# Patient Record
Sex: Female | Born: 1980 | Race: Black or African American | Hispanic: No | Marital: Single | Smoking: Never smoker
Health system: Southern US, Community
[De-identification: ages and names within clinical notes are randomized; demographics above are authoritative.]

---

## 2014-12-12 ENCOUNTER — Emergency Department (HOSPITAL_COMMUNITY)
Admission: EM | Admit: 2014-12-12 | Discharge: 2014-12-12 | Disposition: A | Discharge status: Home / Self Care | Payer: Self-pay | Attending: Emergency Medicine | Admitting: Emergency Medicine

## 2014-12-12 ENCOUNTER — Encounter (HOSPITAL_COMMUNITY): Payer: Self-pay | Admitting: Emergency Medicine

## 2014-12-12 DIAGNOSIS — R21 Rash and other nonspecific skin eruption: Secondary | ICD-10-CM | POA: Insufficient documentation

## 2014-12-12 MED ORDER — BACITRACIN 500 UNIT/GM EX OINT
1.0000 "application " | TOPICAL_OINTMENT | Freq: Two times a day (BID) | CUTANEOUS | Status: DC
  Administered 2014-12-12: 1 via TOPICAL
  Filled 2014-12-12: qty 0.9

## 2014-12-12 MED ORDER — SULFAMETHOXAZOLE-TRIMETHOPRIM 800-160 MG PO TABS: 1.0000 | ORAL_TABLET | Freq: Two times a day (BID) | ORAL | Status: DC

## 2014-12-12 MED ORDER — HYDROXYZINE HCL 25 MG PO TABS: 25.0000 mg | ORAL_TABLET | Freq: Four times a day (QID) | ORAL | Status: DC

## 2014-12-12 MED ORDER — LIDOCAINE HCL (PF) 1 % IJ SOLN
2.1000 mL | Freq: Once | INTRAMUSCULAR | Status: AC
  Administered 2014-12-12: 2.1 mL
  Filled 2014-12-12: qty 5

## 2014-12-12 MED ORDER — HYDROXYZINE HCL 25 MG PO TABS
50.0000 mg | ORAL_TABLET | Freq: Once | ORAL | Status: AC
  Administered 2014-12-12: 50 mg via ORAL
  Filled 2014-12-12: qty 2

## 2014-12-12 MED ORDER — CEPHALEXIN 500 MG PO CAPS: 500.0000 mg | ORAL_CAPSULE | Freq: Four times a day (QID) | ORAL | Status: DC

## 2014-12-12 MED ORDER — CEFTRIAXONE SODIUM 1 G IJ SOLR
1.0000 g | Freq: Once | INTRAMUSCULAR | Status: AC
  Administered 2014-12-12: 1 g via INTRAMUSCULAR
  Filled 2014-12-12: qty 10
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
## 2014-12-12 NOTE — Discharge Instructions (Signed)
Wash the affected area with soap and water and apply a thin layer of topical antibiotic ointment. Do this every 12 hours.   Do not use rubbing alcohol or hydrogen peroxide.                        Look for signs of  Worsening infection: if you see redness, if the area becomes warm, if pain increases sharply, there is discharge (pus), if red streaks appear or you develop fever or vomiting, RETURN immediately to the Emergency Department  for a recheck.   Take your antibiotics as directed and to completion. You should never have any leftover antibiotics! Push fluids and stay well hydrated.   Any antibiotic use can reduce the efficacy of hormonal birth control. Please use back up method of contraception.

## 2014-12-12 NOTE — ED Notes (Signed)
PA at bedside.

## 2014-12-12 NOTE — ED Notes (Signed)
Pt. Reports progressing itchy rashes at right antecubital area onset last week with drainage .

## 2016-06-04 ENCOUNTER — Emergency Department (HOSPITAL_COMMUNITY): Payer: Medicaid Other

## 2016-06-04 ENCOUNTER — Encounter (HOSPITAL_COMMUNITY): Payer: Self-pay | Admitting: Emergency Medicine

## 2016-06-04 ENCOUNTER — Emergency Department (HOSPITAL_COMMUNITY)
Admission: EM | Admit: 2016-06-04 | Discharge: 2016-06-04 | Disposition: A | Discharge status: Home / Self Care | Payer: Medicaid Other | Attending: Emergency Medicine | Admitting: Emergency Medicine

## 2016-06-04 DIAGNOSIS — S0990XA Unspecified injury of head, initial encounter: Secondary | ICD-10-CM | POA: Diagnosis present

## 2016-06-04 DIAGNOSIS — Y999 Unspecified external cause status: Secondary | ICD-10-CM | POA: Diagnosis not present

## 2016-06-04 DIAGNOSIS — Y92410 Unspecified street and highway as the place of occurrence of the external cause: Secondary | ICD-10-CM | POA: Insufficient documentation

## 2016-06-04 DIAGNOSIS — S0181XA Laceration without foreign body of other part of head, initial encounter: Secondary | ICD-10-CM | POA: Insufficient documentation

## 2016-06-04 DIAGNOSIS — S161XXA Strain of muscle, fascia and tendon at neck level, initial encounter: Secondary | ICD-10-CM | POA: Diagnosis not present

## 2016-06-04 DIAGNOSIS — Y939 Activity, unspecified: Secondary | ICD-10-CM | POA: Diagnosis not present

## 2016-06-04 DIAGNOSIS — T148XXA Other injury of unspecified body region, initial encounter: Secondary | ICD-10-CM

## 2016-06-04 MED ORDER — ACETAMINOPHEN 500 MG PO TABS: 1000.0000 mg | ORAL_TABLET | Freq: Three times a day (TID) | ORAL | 0 refills | Status: AC

## 2016-06-04 MED ORDER — LIDOCAINE-EPINEPHRINE-TETRACAINE (LET) SOLUTION
3.0000 mL | Freq: Once | NASAL | Status: AC
  Administered 2016-06-04: 3 mL via TOPICAL
  Filled 2016-06-04: qty 3

## 2016-06-04 MED ORDER — LIDOCAINE-EPINEPHRINE (PF) 2 %-1:200000 IJ SOLN
20.0000 mL | Freq: Once | INTRAMUSCULAR | Status: AC
  Administered 2016-06-04: 20 mL via INTRADERMAL
  Filled 2016-06-04: qty 20

## 2016-06-04 MED ORDER — LIDOCAINE-EPINEPHRINE 2 %-1:100000 IJ SOLN
10.0000 mL | Freq: Once | INTRAMUSCULAR | Status: DC
  Filled 2016-06-04: qty 10

## 2016-06-04 MED ORDER — IBUPROFEN 600 MG PO TABS: 600.0000 mg | ORAL_TABLET | Freq: Four times a day (QID) | ORAL | 0 refills | Status: DC | PRN

## 2016-06-04 MED ORDER — FENTANYL CITRATE (PF) 100 MCG/2ML IJ SOLN
50.0000 ug | Freq: Once | INTRAMUSCULAR | Status: AC
  Administered 2016-06-04: 50 ug via INTRAMUSCULAR
  Filled 2016-06-04: qty 2
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
## 2016-06-04 NOTE — ED Notes (Signed)
Pt stable, understands discharge instructions, and reasons for return.   

## 2016-06-04 NOTE — ED Notes (Signed)
[employer] at bedside 
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
## 2016-06-04 NOTE — ED Notes (Signed)
Dr. Cardama at bedside.  

## 2016-06-27 ENCOUNTER — Emergency Department (HOSPITAL_COMMUNITY)
Admission: EM | Admit: 2016-06-27 | Discharge: 2016-06-27 | Disposition: A | Discharge status: Home / Self Care | Payer: Medicaid Other | Attending: Emergency Medicine | Admitting: Emergency Medicine

## 2016-06-27 ENCOUNTER — Encounter (HOSPITAL_COMMUNITY): Payer: Self-pay

## 2016-06-27 DIAGNOSIS — Z4802 Encounter for removal of sutures: Secondary | ICD-10-CM | POA: Insufficient documentation

## 2016-06-27 NOTE — ED Triage Notes (Signed)
Pt presents for suture removal, placed 11/14 above R eyebrow.
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
## 2016-09-17 ENCOUNTER — Encounter (HOSPITAL_COMMUNITY): Payer: Self-pay | Admitting: Emergency Medicine

## 2016-09-17 ENCOUNTER — Emergency Department (HOSPITAL_COMMUNITY)
Admission: EM | Admit: 2016-09-17 | Discharge: 2016-09-17 | Disposition: A | Discharge status: Home / Self Care | Payer: No Typology Code available for payment source | Attending: Physician Assistant | Admitting: Physician Assistant

## 2016-09-17 DIAGNOSIS — Y999 Unspecified external cause status: Secondary | ICD-10-CM | POA: Insufficient documentation

## 2016-09-17 DIAGNOSIS — M545 Low back pain: Secondary | ICD-10-CM | POA: Insufficient documentation

## 2016-09-17 DIAGNOSIS — Y939 Activity, unspecified: Secondary | ICD-10-CM | POA: Diagnosis not present

## 2016-09-17 DIAGNOSIS — Y92410 Unspecified street and highway as the place of occurrence of the external cause: Secondary | ICD-10-CM | POA: Insufficient documentation

## 2016-09-17 MED ORDER — METHOCARBAMOL 500 MG PO TABS: 500.0000 mg | ORAL_TABLET | Freq: Two times a day (BID) | ORAL | 0 refills | Status: DC

## 2016-09-17 MED ORDER — IBUPROFEN 800 MG PO TABS: 800.0000 mg | ORAL_TABLET | Freq: Three times a day (TID) | ORAL | 0 refills | Status: DC

## 2016-09-17 MED ORDER — LIDOCAINE 5 % EX PTCH: 1.0000 | MEDICATED_PATCH | CUTANEOUS | 0 refills | Status: DC
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
## 2016-09-17 NOTE — Discharge Instructions (Signed)
Expect your soreness to increase over the next 2-3 days. Take it easy, but do not lay around too much as this may make any stiffness worse.  °Antiinflammatory medications: Take 500 mg of naproxen every 12 hours or 800 mg of ibuprofen every 8 hours for the next 3 days. Take these medications with food to avoid upset stomach. Choose only one of these medications, do not take them together. ° °Muscle relaxer: Robaxin is a muscle relaxer and may help loosen stiff muscles. Do not take the Robaxin while driving or performing other dangerous activities.  ° °Lidocaine patches: These are available via either prescription or over-the-counter. The over-the-counter option may be more economical one and are likely just as effective. There are multiple over-the-counter brands, such as Salonpas. ° °Exercises: Be sure to perform the attached exercises starting with three times a week and working up to performing them daily. This is an essential part of preventing long term problems.  ° °Follow up with a primary care provider for any future management of these complaints. °

## 2017-06-26 ENCOUNTER — Encounter (HOSPITAL_COMMUNITY): Payer: Self-pay | Admitting: Emergency Medicine

## 2017-06-26 ENCOUNTER — Emergency Department (HOSPITAL_COMMUNITY)
Admission: EM | Admit: 2017-06-26 | Discharge: 2017-06-27 | Disposition: A | Discharge status: Home / Self Care | Payer: Self-pay | Attending: Emergency Medicine | Admitting: Emergency Medicine

## 2017-06-26 DIAGNOSIS — M2550 Pain in unspecified joint: Secondary | ICD-10-CM | POA: Insufficient documentation

## 2017-06-26 DIAGNOSIS — J45909 Unspecified asthma, uncomplicated: Secondary | ICD-10-CM | POA: Insufficient documentation

## 2017-06-26 DIAGNOSIS — L301 Dyshidrosis [pompholyx]: Secondary | ICD-10-CM | POA: Insufficient documentation

## 2017-06-26 MED ORDER — DEXAMETHASONE SODIUM PHOSPHATE 10 MG/ML IJ SOLN
10.0000 mg | Freq: Once | INTRAMUSCULAR | Status: AC
  Administered 2017-06-27: 10 mg via INTRAVENOUS
  Filled 2017-06-26: qty 1

## 2017-06-26 MED ORDER — IPRATROPIUM-ALBUTEROL 0.5-2.5 (3) MG/3ML IN SOLN
3.0000 mL | Freq: Once | RESPIRATORY_TRACT | Status: AC
  Administered 2017-06-27: 3 mL via RESPIRATORY_TRACT
  Filled 2017-06-26: qty 3

## 2017-06-26 MED ORDER — SODIUM CHLORIDE 0.9 % IV BOLUS (SEPSIS)
1000.0000 mL | Freq: Once | INTRAVENOUS | Status: AC
  Administered 2017-06-27: 1000 mL via INTRAVENOUS

## 2017-06-26 NOTE — ED Triage Notes (Signed)
Patient here with complaints of generalized body aches and joint pain. Reports bilateral hand rash and hand pain that started 1 week ago. Rash noted to bilateral hands, scaling, and swelling.

## 2017-06-26 NOTE — ED Provider Notes (Signed)
[facility]-EMERGENCY DEPT Provider Note   CSN: [id number] Arrival date & time: 06/26/17  2020     History   Chief Complaint Chief Complaint  Patient presents with  . Hand Pain  . Generalized Body Aches  . Joint Pain    HPI Mary Newton is a 36 y.o. female.  Patient presents with arthralgias and rash limited to palms and soles for the past 1 week. No fever. No joint redness or swelling of joints. No nausea, vomiting, cough, URI symptoms. She has a history of mild, seasonal asthma and uses a prn inhaler only, without change in her usual course. The rash started first followed by joint pain one day later. No alleviating or aggravating factors.   The history is provided by the patient. No language interpreter was used.  Hand Pain     History reviewed. No pertinent past medical history.  There are no active problems to display for this patient.   History reviewed. No pertinent surgical history.  OB History    No data available       Home Medications    Prior to Admission medications   Medication Sig Start Date End Date Taking? Authorizing Provider  diphenhydrAMINE (BENADRYL) 25 mg capsule Take 25 mg by mouth every 6 (six) hours as needed for itching.   Yes [provider]    Family History No family history on file.  Social History Social History   Tobacco Use  . Smoking status: Never Smoker  . Smokeless tobacco: Never Used  Substance Use Topics  . Alcohol use: Yes    Alcohol/week: 1.2 oz    Types: 2 Cans of beer per week  . Drug use: No     Allergies   Patient has no known allergies.   Review of Systems Review of Systems  Constitutional: Negative for chills and fever.  HENT: Negative.   Respiratory: Negative.   Cardiovascular: Negative.   Gastrointestinal: Negative.   Musculoskeletal: Positive for arthralgias. Negative for myalgias.  Skin: Positive for color change and rash. Negative for wound.  Neurological:  Negative.      Physical Exam Updated Vital Signs BP 108/76 (BP Location: Right Arm)   Pulse (!) 119   Temp 98.2 F (36.8 C) (Oral)   Resp 20   SpO2 99%   Physical Exam  Constitutional: She appears well-developed and well-nourished.  HENT:  Head: Normocephalic.  Neck: Normal range of motion. Neck supple.  Cardiovascular: Normal rate, regular rhythm and intact distal pulses.  Pulmonary/Chest: Effort normal. She has wheezes (Mild expiratory wheezing, R>L.).  Abdominal: Soft. Bowel sounds are normal. There is no tenderness. There is no rebound and no guarding.  Musculoskeletal: Normal range of motion.  Neurological: She is alert.  Skin: Skin is warm and dry.  Palms of hands have thickened, hyperpigmented, scaling skin. No blistering, drainage, wound or bleeding. Soles of feet have similar rash limited to phalanges.   Psychiatric: She has a normal mood and affect.  Nursing note and vitals reviewed.    ED Treatments / Results  Labs (all labs ordered are listed, but only abnormal results are displayed) Labs Reviewed  CBC WITH DIFFERENTIAL/PLATELET  BASIC METABOLIC PANEL  SEDIMENTATION RATE   Results for orders placed or performed during the hospital encounter of 06/26/17  CBC with Differential  Result Value Ref Range   WBC 9.4 4.0 - 10.5 K/uL   RBC 4.35 3.87 - 5.11 MIL/uL   Hemoglobin 13.1 12.0 - 15.0 g/dL   HCT  38.2 36.0 - 46.0 %   MCV 87.8 78.0 - 100.0 fL   MCH 30.1 26.0 - 34.0 pg   MCHC 34.3 30.0 - 36.0 g/dL   RDW 01.013.3 27.211.5 - 53.615.5 %   Platelets 257 150 - 400 K/uL   Neutrophils Relative % 74 %   Neutro Abs 7.0 1.7 - 7.7 K/uL   Lymphocytes Relative 22 %   Lymphs Abs 2.1 0.7 - 4.0 K/uL   Monocytes Relative 3 %   Monocytes Absolute 0.2 0.1 - 1.0 K/uL   Eosinophils Relative 1 %   Eosinophils Absolute 0.1 0.0 - 0.7 K/uL   Basophils Relative 0 %   Basophils Absolute 0.0 0.0 - 0.1 K/uL  Basic metabolic panel  Result Value Ref Range   Sodium 136 135 - 145 mmol/L    Potassium 3.9 3.5 - 5.1 mmol/L   Chloride 105 101 - 111 mmol/L   CO2 23 22 - 32 mmol/L   Glucose, Bld 124 (H) 65 - 99 mg/dL   BUN 11 6 - 20 mg/dL   Creatinine, Ser 6.440.73 0.44 - 1.00 mg/dL   Calcium 8.5 (L) 8.9 - 10.3 mg/dL   GFR calc non Af Amer >60 >60 mL/min   GFR calc Af Amer >60 >60 mL/min   Anion gap 8 5 - 15  Sedimentation rate  Result Value Ref Range   Sed Rate 6 0 - 22 mm/hr    EKG  EKG Interpretation None       Radiology No results found.  Procedures Procedures (including critical care time)  Medications Ordered in ED Medications - No data to display   Initial Impression / Assessment and Plan / ED Course  I have reviewed the triage vital signs and the nursing notes.  Pertinent labs & imaging results that were available during my care of the patient were reviewed by me and considered in my medical decision making (see chart for details).     Patient presents with rash and generalized arthralgias x 1 week without systemic illness.   Rash appears eczematous, joints are unremarkable in appearance. She is afebrile but tachycardic to 120 with BP 108/76. Will check labs, give one liter of fluids and recheck heart rate.  Heart rate improved with IV fluids. Labs reassuring, no sign of infection. Rash on hands and feet appear c/w eczema. She received one dose Decadron here and will discharge with topical steroids for symptomatic relief. Will provide resources for primary care follow up.  Final Clinical Impressions(s) / ED Diagnoses   Final diagnoses:  None   1. dyshidrotic eczema 2. Arthralgias  ED Discharge Orders    None       Danne HarborUpstill, Jathen Sudano, PA-C 06/27/17 [phone]    Tilden Fossaees, Elizabeth, MD 06/29/17 [phone]

## 2017-06-27 LAB — BASIC METABOLIC PANEL
Anion gap: 8 (ref 5–15)
BUN: 11 mg/dL (ref 6–20)
CALCIUM: 8.5 mg/dL — AB (ref 8.9–10.3)
CO2: 23 mmol/L (ref 22–32)
Chloride: 105 mmol/L (ref 101–111)
Creatinine, Ser: 0.73 mg/dL (ref 0.44–1.00)
GFR calc Af Amer: >60 mL/min (ref >60)
GFR calc non Af Amer: >60 mL/min (ref >60)
GLUCOSE: 124 mg/dL — AB (ref 65–99)
POTASSIUM: 3.9 mmol/L (ref 3.5–5.1)
Sodium: 136 mmol/L (ref 135–145)

## 2017-06-27 LAB — RPR: RPR Ser Ql: NONREACTIVE

## 2017-06-27 LAB — CBC WITH DIFFERENTIAL/PLATELET
Basophils Absolute: 0 10*3/uL (ref 0.0–0.1)
Basophils Relative: 0 %
Eosinophils Absolute: 0.1 10*3/uL (ref 0.0–0.7)
Eosinophils Relative: 1 %
HCT: 38.2 % (ref 36.0–46.0)
Hemoglobin: 13.1 g/dL (ref 12.0–15.0)
LYMPHS ABS: 2.1 10*3/uL (ref 0.7–4.0)
Lymphocytes Relative: 22 %
MCH: 30.1 pg (ref 26.0–34.0)
MCHC: 34.3 g/dL (ref 30.0–36.0)
MCV: 87.8 fL (ref 78.0–100.0)
MONO ABS: 0.2 10*3/uL (ref 0.1–1.0)
MONOS PCT: 3 %
Neutro Abs: 7 10*3/uL (ref 1.7–7.7)
Neutrophils Relative %: 74 %
Platelets: 257 10*3/uL (ref 150–400)
RBC: 4.35 MIL/uL (ref 3.87–5.11)
RDW: 13.3 % (ref 11.5–15.5)
WBC: 9.4 10*3/uL (ref 4.0–10.5)

## 2017-06-27 LAB — HIV ANTIBODY (ROUTINE TESTING W REFLEX): HIV Screen 4th Generation wRfx: NONREACTIVE

## 2017-06-27 LAB — SEDIMENTATION RATE: Sed Rate: 6 mm/hr (ref 0–22)

## 2017-06-27 MED ORDER — TRIAMCINOLONE ACETONIDE 0.1 % EX CREA: 1.0000 "application " | TOPICAL_CREAM | Freq: Two times a day (BID) | CUTANEOUS | 0 refills | Status: DC

## 2017-06-27 MED ORDER — NAPROXEN 375 MG PO TABS: 375.0000 mg | ORAL_TABLET | Freq: Two times a day (BID) | ORAL | 0 refills | Status: DC
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
## 2019-10-08 ENCOUNTER — Ambulatory Visit: Payer: Medicaid Other | Attending: Internal Medicine

## 2020-04-11 ENCOUNTER — Other Ambulatory Visit: Payer: Self-pay

## 2020-04-11 DIAGNOSIS — Z20822 Contact with and (suspected) exposure to covid-19: Secondary | ICD-10-CM

## 2020-04-13 LAB — SARS-COV-2, NAA 2 DAY TAT

## 2020-04-13 LAB — NOVEL CORONAVIRUS, NAA: SARS-CoV-2, NAA: NOT DETECTED

## 2020-07-20 ENCOUNTER — Ambulatory Visit: Payer: Medicaid Other

## 2023-08-12 ENCOUNTER — Encounter (HOSPITAL_COMMUNITY): Payer: Self-pay | Admitting: *Deleted

## 2023-08-12 ENCOUNTER — Other Ambulatory Visit: Payer: Self-pay

## 2023-08-12 ENCOUNTER — Emergency Department (HOSPITAL_COMMUNITY)
Admission: EM | Admit: 2023-08-12 | Discharge: 2023-08-12 | Disposition: A | Discharge status: Home / Self Care | Payer: Self-pay

## 2023-08-12 DIAGNOSIS — L089 Local infection of the skin and subcutaneous tissue, unspecified: Secondary | ICD-10-CM | POA: Insufficient documentation

## 2023-08-12 DIAGNOSIS — L309 Dermatitis, unspecified: Secondary | ICD-10-CM | POA: Insufficient documentation

## 2023-08-12 MED ORDER — TRIAMCINOLONE ACETONIDE 0.1 % EX CREA: 1.0000 | TOPICAL_CREAM | Freq: Two times a day (BID) | CUTANEOUS | 1 refills | Status: DC

## 2023-08-12 MED ORDER — TRIAMCINOLONE ACETONIDE 0.1 % EX CREA: 1.0000 | TOPICAL_CREAM | Freq: Two times a day (BID) | CUTANEOUS | 0 refills | Status: DC

## 2023-08-12 MED ORDER — DOXYCYCLINE HYCLATE 100 MG PO CAPS: 100.0000 mg | ORAL_CAPSULE | Freq: Two times a day (BID) | ORAL | 0 refills | Status: DC

## 2023-08-12 NOTE — ED Provider Notes (Signed)
[place] EMERGENCY DEPARTMENT AT [facility] Provider Note   CSN: [id number] Arrival date & time: 08/12/23  8657     History  Chief Complaint  Patient presents with   finger infections    Mary Newton is a 43 y.o. female.  Pt complains of an infection to her fingers.  Pt has a history of dyshidrotic eczema.  Patient reports she is not currently on any medications.  Patient does not have to have a primary care physician.  Pt reports drainage from the tips of her fingers. No fever or chills   The history is provided by the patient. No language interpreter was used.       Home Medications Prior to Admission medications   Medication Sig Start Date End Date Taking? Authorizing Provider  diphenhydrAMINE (BENADRYL) 25 mg capsule Take 25 mg by mouth every 6 (six) hours as needed for itching.    [provider]  naproxen (NAPROSYN) 375 MG tablet Take 1 tablet (375 mg total) by mouth 2 (two) times daily. 06/27/17   Elpidio Anis, PA-C  triamcinolone cream (KENALOG) 0.1 % Apply 1 application topically 2 (two) times daily. 06/27/17   Elpidio Anis, PA-C      Allergies    Patient has no known allergies.    Review of Systems   Review of Systems  All other systems reviewed and are negative.   Physical Exam Updated Vital Signs BP (!) 153/113 (BP Location: Left Arm)   Pulse 76   Temp 97.7 F (36.5 C) (Oral)   Wt 61.7 kg   SpO2 97%   BMI 23.34 kg/m  Physical Exam Vitals reviewed.  Constitutional:      Appearance: Normal appearance.  Musculoskeletal:        General: Swelling and tenderness present.     Comments: Scaly rash, dark skin,  drainage finger tips under nails   Skin:    General: Skin is warm.  Neurological:     General: No focal deficit present.     Mental Status: She is alert.     ED Results / Procedures / Treatments   Labs (all labs ordered are listed, but only abnormal results are displayed) Labs Reviewed - No data to  display  EKG None  Radiology No results found.  Procedures Procedures    Medications Ordered in ED Medications - No data to display  ED Course/ Medical Decision Making/ A&P                                 Medical Decision Making Patient has a history of eczema.  Patient reports she thinks she has infection to her fingertips from the breaks in her skin  Risk Prescription drug management. Risk Details: Patient given a prescription for doxycycline for 10 days.  Patient is given a prescription for triamcinolone cream.  Patient is advised to follow-up with primary care and with dermatology.  She is advised to return to the emergency department if symptoms worsen or change.  Patient is given a note to be out of work for the next 3 days.           Final Clinical Impression(s) / ED Diagnoses Final diagnoses:  Finger infection  Eczema, unspecified type    Rx / DC Orders ED Discharge Orders          Ordered    triamcinolone cream (KENALOG) 0.1 %  2 times daily  08/12/23 0952    doxycycline (VIBRAMYCIN) 100 MG capsule  2 times daily        08/12/23 1610          An After Visit Summary was printed and given to the patient.     Elson Areas, [place] 08/12/23 [id number]    Durwin Glaze, MD 08/12/23 [phone]

## 2023-08-12 NOTE — Discharge Instructions (Addendum)
Return if any problems.

## 2023-08-12 NOTE — ED Triage Notes (Signed)
Here by POV from home for multiple finger infections which she r/t eczema flare. Onset 3d ago. Endorses pain, buring and itching, and scattered open wounds to multiple fingers, primarily 4th and 5th digit bilateral hands below nails. Does not have PCP or dermatologist. Employer would not let her work.

## 2023-08-19 ENCOUNTER — Ambulatory Visit: Payer: Managed Care, Other (non HMO) | Admitting: Nurse Practitioner

## 2023-08-19 ENCOUNTER — Encounter: Payer: Self-pay | Admitting: Nurse Practitioner

## 2023-08-19 VITALS — BP 146/108

## 2023-08-19 DIAGNOSIS — L301 Dyshidrosis [pompholyx]: Secondary | ICD-10-CM | POA: Insufficient documentation

## 2023-08-19 DIAGNOSIS — R030 Elevated blood-pressure reading, without diagnosis of hypertension: Secondary | ICD-10-CM

## 2023-08-19 MED ORDER — PREDNISONE 10 MG PO TABS: ORAL_TABLET | ORAL | 0 refills | Status: AC

## 2023-08-19 NOTE — Progress Notes (Signed)
Acute Office Visit  Subjective:     Patient ID: Mary Newton, female    DOB: 18-Aug-1980, 43 y.o.   MRN: [id number]  Chief Complaint  Patient presents with   Eczema    HPI Patient is in today for flare-up of dyshidrotic eczema. Started 2 weeks ago.   She was seen in ED 08/12/23- given triamcinolone cream and doxycycline for potential infection on fingertips. Reports completing antibiotics. She states triamcinolone cream is not providing any relief.  Has seen dermatology in the past for this. States she was diagnosed with dyshidrotic eczema in 2019.  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  Not sure what triggers this. Rash hurts and itches, cuts are painful Hot water makes it feel better.   Also denies hx of HTN but blood pressure is elevated today. Endorses family history of hypertension.    Review of Systems  Skin:  Positive for itching and rash.       On hands        Objective:    BP (!) 146/108   LMP 08/11/2023 (Exact Date)    Physical Exam HENT:     Head: Normocephalic.  Cardiovascular:     Rate and Rhythm: Normal rate and regular rhythm.  Pulmonary:     Effort: Pulmonary effort is normal.  Skin:    General: Skin is dry.     Findings: Rash present.     Comments: Dry skin and blistering noted on bilateral palms of hands. Rash is scaly and some darkening of the skin in noted. No drainage present.   Neurological:     General: No focal deficit present.     Mental Status: She is alert and oriented to person, place, and time.     No results found for any visits on 08/19/23.      Assessment & Plan:   Problem List Items Addressed This Visit   None Visit Diagnoses       Dyshidrotic eczema    -  Primary   Relevant Medications   predniSONE (DELTASONE) 10 MG tablet     Elevated BP without diagnosis of hypertension      Plan for prednisone taper. Recommend dermatology referral once insurance kicks  in. Discussed supportive measures.  BP log x 2 weeks. Follow-up for further management.      Meds ordered this encounter  Medications   predniSONE (DELTASONE) 10 MG tablet    Sig: Take 4 tablets (40 mg total) by mouth daily with breakfast for 3 days, THEN 3 tablets (30 mg total) daily with breakfast for 3 days, THEN 2 tablets (20 mg total) daily with breakfast for 3 days, THEN 1 tablet (10 mg total) daily with breakfast for 3 days.    Dispense:  30 tablet    Refill:  0    Supervising Provider:   Erasmo Downer [[id number]]   In 2 weeks for BP follow-up.   Gloris Ham, NP

## 2023-09-09 ENCOUNTER — Encounter: Payer: Self-pay | Admitting: Nurse Practitioner

## 2023-09-09 ENCOUNTER — Ambulatory Visit: Payer: Managed Care, Other (non HMO) | Admitting: Nurse Practitioner

## 2023-09-09 VITALS — BP 128/90 | HR 89

## 2023-09-09 DIAGNOSIS — L301 Dyshidrosis [pompholyx]: Secondary | ICD-10-CM

## 2023-09-09 MED ORDER — TRIAMCINOLONE ACETONIDE 0.5 % EX OINT
1.0000 | TOPICAL_OINTMENT | Freq: Two times a day (BID) | CUTANEOUS | 0 refills | Status: DC | PRN
Start: 2023-09-09 — End: 2024-04-13

## 2023-09-09 NOTE — Progress Notes (Signed)
Acute Office Visit  Subjective:     Patient ID: Mary Newton, female    DOB: 1981/04/12, 43 y.o.   MRN: [id number]  No chief complaint on file.   HPI Patient is in today for c/o eczema of hands. Last office visit 08/19/23- treated with steroid taper. Improved with previous prednisone taper. Prior to that, was treated with doxycycline and triamcinolone cream.  Reports symptoms have improved since last visit, however lesions and dryness persist. 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 Reports hands are very dry and hurt due to cuts on skin. Uses dove soap for sensitive skin at home  She does not have insurance at this time. Has not been able to see dermatology although she is open to paying out of pocket.    Review of Systems  Skin:  Positive for rash.        Objective:    BP (!) 128/90 (BP Location: Right Arm, Cuff Size: Normal)   Pulse 89   LMP 08/11/2023 (Exact Date)   SpO2 97%    Physical Exam Constitutional:      General: She is not in acute distress. Pulmonary:     Effort: Pulmonary effort is normal.  Skin:    Findings: Rash present.     Comments:  Dry skin and multiple small blisters noted on bilateral hands, mainly in fingers. Rash has cleared near her palms compared to previous visit. No drainage present.   Neurological:     Mental Status: She is alert.     No results found for any visits on 09/09/23.      Assessment & Plan:   Problem List Items Addressed This Visit       Musculoskeletal and Integument   Dyshidrotic eczema - Primary   Relevant Orders   Ambulatory referral to Dermatology  No concerns for infection. Reviewed self care measures. Recommended use of Theraseal or gloves in a bottle lotion. Can also consider use of white cotton gloves under her gloves at work. Plan to switch triamcinolone cream to ointment. Instructed to use ointment at night and cover  with white cotton gloves.  Will refer to dermatology for further management.   Meds ordered this encounter  Medications   triamcinolone ointment (KENALOG) 0.5 %    Sig: Apply 1 Application topically 2 (two) times daily as needed. Apply thin layer of ointment to hands    Dispense:  30 g    Refill:  0    Supervising Provider:   Erasmo Downer [[id number]]   As needed.   Gloris Ham, NP

## 2024-03-01 ENCOUNTER — Ambulatory Visit: Admitting: Physician Assistant

## 2024-04-13 ENCOUNTER — Encounter: Payer: Self-pay | Admitting: Nurse Practitioner

## 2024-04-13 ENCOUNTER — Ambulatory Visit: Payer: Self-pay | Admitting: Nurse Practitioner

## 2024-04-13 VITALS — BP 132/90 | HR 71 | Temp 97.1°F

## 2024-04-13 DIAGNOSIS — R030 Elevated blood-pressure reading, without diagnosis of hypertension: Secondary | ICD-10-CM

## 2024-04-13 DIAGNOSIS — Z1231 Encounter for screening mammogram for malignant neoplasm of breast: Secondary | ICD-10-CM

## 2024-04-13 DIAGNOSIS — Z789 Other specified health status: Secondary | ICD-10-CM

## 2024-04-13 NOTE — Progress Notes (Signed)
 Occupational [facility]- [facility]  Subjective:  Patient ID: Mary Newton, female    DOB: 07-Dec-1980  Age: 43 y.o. MRN: [id number]  CC: wellness exam   HPI Mary Newton presents for wellness exam visit for insurance benefit.  Patient has a PCP: No PCP PMH significant for: dyshidrotic eczema  Last labs per PCP were completed: several years.   Health Maintenance:  Colonoscopy: no family history.  Mammogram: denies family hx, never done Pap: needs update.     Smoker:never  Immunizations:  Flu- yearly  Tdap: unsure   Lifestyle: Diet- no particular diet.  Exercise-none     Elevated BP readings at work. Reports SBP 180s. She is working at the time readings are checked. Has some headaches.   No past medical history on file.  No past surgical history on file.  Outpatient Medications Prior to Visit  Medication Sig Dispense Refill   clobetasol ointment (TEMOVATE) 0.05 % Apply to the affected areas on the bilateral hands 1-2 times daily as needed     hydrocortisone 2.5 % cream Apply to the affected areas on the face and neck 1-2 times daily as needed.     silver sulfADIAZINE (SILVADENE) 1 % cream Apply to the open areas on the body 1-2 times daily as needed     tacrolimus (PROTOPIC) 0.1 % ointment Apply to the affected areas on the body 1-2 times daily as needed (non-steroidal)     diphenhydrAMINE (BENADRYL) 25 mg capsule Take 25 mg by mouth every 6 (six) hours as needed for itching. (Patient not taking: Reported on 04/13/2024)     naproxen  (NAPROSYN ) 375 MG tablet Take 1 tablet (375 mg total) by mouth 2 (two) times daily. (Patient not taking: Reported on 04/13/2024) 20 tablet 0   triamcinolone  ointment (KENALOG ) 0.5 % Apply 1 Application topically 2 (two) times daily as needed. Apply thin layer of ointment to hands 30 g 0   No facility-administered medications prior to visit.    ROS Review of Systems  HENT:  Negative for hearing loss.   Eyes:  Negative for visual  disturbance.  Respiratory:  Negative for shortness of breath.   Cardiovascular:  Negative for chest pain.  Gastrointestinal:  Negative for constipation, diarrhea, nausea and vomiting.  Musculoskeletal: Negative.   Neurological:  Positive for headaches. Negative for dizziness.  Psychiatric/Behavioral:  Positive for sleep disturbance (trouble staying asleep.).     Objective:  BP (!) 132/90   Pulse 71   Temp (!) 97.1 F (36.2 C) (Temporal)   LMP 04/02/2024 (Exact Date)   SpO2 98%   Physical Exam Constitutional:      General: She is not in acute distress. HENT:     Head: Normocephalic.     Right Ear: Tympanic membrane, ear canal and external ear normal. There is no impacted cerumen.     Left Ear: Tympanic membrane, ear canal and external ear normal. There is no impacted cerumen.     Nose: Nose normal.     Mouth/Throat:     Mouth: Mucous membranes are moist.     Pharynx: No oropharyngeal exudate or posterior oropharyngeal erythema.  Eyes:     Pupils: Pupils are equal, round, and reactive to light.  Cardiovascular:     Rate and Rhythm: Normal rate and regular rhythm.     Heart sounds: Normal heart sounds.  Pulmonary:     Effort: Pulmonary effort is normal.     Breath sounds: Normal breath sounds.  Musculoskeletal:  General: Normal range of motion.  Skin:    General: Skin is warm.  Neurological:     General: No focal deficit present.     Mental Status: She is alert and oriented to person, place, and time.  Psychiatric:        Mood and Affect: Mood normal.        Behavior: Behavior normal.      Assessment & Plan:    Mary Newton was seen today for wellness exam.  Diagnoses and all orders for this visit:  Participant in health and wellness plan Adult wellness physical was conducted today. Importance of diet and exercise were discussed in detail.  We reviewed immunizations and gave recommendations regarding current immunization needed for age. Likely needs TDAP, will  check [facility] and if needed can update this.  Preventative health exams needed: mammogram ordered. Needs pap, will set her up with a PCP.  Patient was advised yearly wellness exam.  Needs labs, will obtain at next visit.   Encounter for screening mammogram for breast cancer -     MM 3D SCREENING MAMMOGRAM BILATERAL BREAST; Future  Elevated BP without diagnosis of hypertension BP is mildly elevated today. 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 I instructed patient to check readings at home once daily for the next 2 weeks.Instructed on proper procedure to check BP. She will return in 2 weeks for further management   Orders Placed This Encounter  Procedures   MM 3D SCREENING MAMMOGRAM BILATERAL BREAST    No orders of the defined types were placed in this encounter.   Follow-up: in 2 weeks for labs/ BP/ and Tdap

## 2024-05-25 ENCOUNTER — Ambulatory Visit: Payer: Self-pay
